# Patient Record
Sex: Male | Born: 1970
Health system: Southern US, Community
[De-identification: ages and names within clinical notes are randomized; demographics above are authoritative.]

## PROBLEM LIST (undated history)

## (undated) DIAGNOSIS — E785 Hyperlipidemia, unspecified: Secondary | ICD-10-CM

## (undated) DIAGNOSIS — T7840XA Allergy, unspecified, initial encounter: Secondary | ICD-10-CM

## (undated) HISTORY — DX: Allergy, unspecified, initial encounter: T78.40XA

## (undated) HISTORY — PX: WISDOM TOOTH EXTRACTION: SHX21

## (undated) HISTORY — DX: Hyperlipidemia, unspecified: E78.5

---

## 1998-01-22 ENCOUNTER — Emergency Department (HOSPITAL_COMMUNITY): Admission: EM | Admit: 1998-01-22 | Discharge: 1998-01-22 | Payer: Self-pay | Admitting: Emergency Medicine

## 2003-11-04 ENCOUNTER — Encounter: Admission: RE | Admit: 2003-11-04 | Discharge: 2003-11-04 | Payer: Self-pay | Admitting: Otolaryngology

## 2004-12-21 ENCOUNTER — Ambulatory Visit (HOSPITAL_COMMUNITY): Admission: RE | Admit: 2004-12-21 | Discharge: 2004-12-21 | Payer: Self-pay | Admitting: Orthopedic Surgery

## 2016-07-02 ENCOUNTER — Encounter: Payer: Self-pay | Admitting: Sports Medicine

## 2016-07-02 ENCOUNTER — Ambulatory Visit
Admission: RE | Admit: 2016-07-02 | Discharge: 2016-07-02 | Disposition: A | Discharge status: Home / Self Care | Payer: BC Managed Care – PPO | Source: Ambulatory Visit | Attending: Sports Medicine | Admitting: Sports Medicine

## 2016-07-02 ENCOUNTER — Ambulatory Visit (INDEPENDENT_AMBULATORY_CARE_PROVIDER_SITE_OTHER): Payer: BC Managed Care – PPO | Admitting: Sports Medicine

## 2016-07-02 ENCOUNTER — Ambulatory Visit: Payer: Self-pay

## 2016-07-02 VITALS — BP 128/80 | HR 72 | Ht 70.0 in | Wt 174.0 lb

## 2016-07-02 DIAGNOSIS — M25512 Pain in left shoulder: Secondary | ICD-10-CM

## 2016-07-02 MED ORDER — NITROGLYCERIN 0.2 MG/HR TD PT24: MEDICATED_PATCH | TRANSDERMAL | 1 refills | Status: DC

## 2016-07-02 NOTE — Assessment & Plan Note (Signed)
Findings on ultrasound are consistent with a partial supraspinatus tear. His strength is maintained on exam. - Initiated nitroglycerin protocol - Provided a home exercise program - Provided therapy and - Complete x-rays of his left shoulder - He will follow-up in 6 weeks to monitor for improvement.

## 2016-07-02 NOTE — Patient Instructions (Signed)
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
## 2016-07-30 ENCOUNTER — Ambulatory Visit: Payer: Self-pay | Admitting: Sports Medicine

## 2016-08-27 ENCOUNTER — Encounter: Payer: Self-pay | Admitting: Sports Medicine

## 2016-08-27 ENCOUNTER — Ambulatory Visit (INDEPENDENT_AMBULATORY_CARE_PROVIDER_SITE_OTHER): Payer: BC Managed Care – PPO | Admitting: Sports Medicine

## 2016-08-27 ENCOUNTER — Ambulatory Visit: Payer: Self-pay

## 2016-08-27 VITALS — BP 113/78 | HR 69 | Ht 70.0 in | Wt 174.0 lb

## 2016-08-27 DIAGNOSIS — M25512 Pain in left shoulder: Secondary | ICD-10-CM

## 2016-08-27 NOTE — Progress Notes (Signed)
  Kyle Bradford - 46 y.o. male MRN [id number]  Date of birth: 02/14/1971  SUBJECTIVE:  Including CC & ROS.   Kyle Bradford is a 46 yo M that is following up for a left partial supraspinatus tear. He has been using a nitro patch and reports improvement in his pain. He has also been doing home exercises. Hasn't been using any medication for pain. He still has been doing certain activities such as putting on his coat. He has some pain with performing a fly while working out.   ROS: No unexpected weight loss, fever, chills, swelling, instability, numbness/tingling, redness, otherwise see HPI    HISTORY: Past Medical, Surgical, Social, and Family History Reviewed & Updated per EMR.   Pertinent Historical Findings include: PSHx -  Never smoker.   DATA REVIEWED: 07/02/16: left shoulder x-ray: no bony pathology  07/02/16: previous ultrasound.   PHYSICAL EXAM:  VS: BP:113/78  HR:69bpm  TEMP: ( )  RESP:   HT:5\' 10"  (177.8 cm)   WT:174 lb (78.9 kg)  BMI:25 PHYSICAL EXAM: Gen: NAD, alert, cooperative with exam, well-appearing HEENT: clear conjunctiva, EOMI CV:  no edema, capillary refill brisk,  Resp: non-labored, normal speech Skin: no rashes, normal turgor  Neuro: no gross deficits.  Psych:  alert and oriented Shoulder: Inspection reveals no abnormalities, atrophy or asymmetry. Palpation is normal with no tenderness over AC joint or bicipital groove. ROM is full in all planes. Rotator cuff strength normal throughout. No signs of impingement with negative Neer and Hawkin's tests, empty can sign. Speeds tests normal. No labral pathology noted with negative Obrien's Normal scapular function observed. No painful arc and no drop arm sign. No apprehension sign Neurovascularly intact   Ultrasound: left shoulder:  The BT was normal appearance in short axis.  The supraspinatus had mild changes consistent with tendinopathy but the partial tear previously seen was much small compared to  previous studies. Small areas of hyperechonicity to suggest scarring taking place. Appears to be 95% healed.  The subscapularis was normal in appearance.  The infraspinatus was normal in appearance.  The [facility] joint did not demonstrate significant degenerative change.   Summary: healing partial tear of supraspinatus.      Ultrasound and interpretation by Clearance Coots, MD   ASSESSMENT & PLAN:   Acute pain of left shoulder Significant improvement in his [place] scan today compared to previous study. Still maintains good strength exam.  - continue nitro patches.  - f/u in 6 weeks to scan one more time.

## 2016-08-29 NOTE — Assessment & Plan Note (Signed)
Significant improvement in his [place] scan today compared to previous study. Still maintains good strength exam.  - continue nitro patches.  - f/u in 6 weeks to scan one more time.

## 2018-05-14 ENCOUNTER — Encounter: Payer: Self-pay | Admitting: Sports Medicine

## 2018-05-14 ENCOUNTER — Ambulatory Visit: Payer: BC Managed Care – PPO | Admitting: Sports Medicine

## 2018-05-14 ENCOUNTER — Ambulatory Visit: Payer: Self-pay

## 2018-05-14 VITALS — BP 118/82 | Ht 70.0 in | Wt 174.0 lb

## 2018-05-14 DIAGNOSIS — M25512 Pain in left shoulder: Principal | ICD-10-CM

## 2018-05-14 DIAGNOSIS — M25511 Pain in right shoulder: Secondary | ICD-10-CM

## 2018-05-14 MED ORDER — PREDNISONE 20 MG PO TABS: 20.0000 mg | ORAL_TABLET | Freq: Two times a day (BID) | ORAL | 0 refills | Status: DC

## 2018-05-14 NOTE — Patient Instructions (Signed)
You have an overuse tendonitis of the infraspinatus tendon (part of your rotator cuff). We will send a short course of medication to take to decrease the inflammation. Please take this with food. This should be much better in 6 weeks and fully recovered in 3 months. Call us if things are not much better in 6 weeks and come back to be seen. You should avoid the yoga moves that are causing pain until you recover.  To strengthen your rotator cuff muscles, please do the following exercises we discussed:   Use a light weight (3-5lb dumbell) to do the following daily (3 sets of 15):   External rotation Raise arms to 90 degrees, rotate backwards Lateral raise from standing Fly Diagonal raise
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
## 2018-05-14 NOTE — Assessment & Plan Note (Signed)
Prednisone - short course HEP to emphasize ER  Modify yoga training and lifts  Reck if not resolved in 12 wks

## 2019-03-05 ENCOUNTER — Other Ambulatory Visit: Payer: Self-pay

## 2019-03-05 DIAGNOSIS — Z20822 Contact with and (suspected) exposure to covid-19: Secondary | ICD-10-CM

## 2019-03-06 LAB — NOVEL CORONAVIRUS, NAA: SARS-CoV-2, NAA: NOT DETECTED

## 2019-04-05 ENCOUNTER — Other Ambulatory Visit: Payer: Self-pay

## 2019-04-05 DIAGNOSIS — Z20822 Contact with and (suspected) exposure to covid-19: Secondary | ICD-10-CM

## 2019-04-06 LAB — NOVEL CORONAVIRUS, NAA: SARS-CoV-2, NAA: NOT DETECTED

## 2019-06-14 ENCOUNTER — Ambulatory Visit: Payer: Self-pay | Attending: Internal Medicine

## 2019-06-14 DIAGNOSIS — Z20822 Contact with and (suspected) exposure to covid-19: Secondary | ICD-10-CM | POA: Insufficient documentation

## 2019-06-15 LAB — NOVEL CORONAVIRUS, NAA: SARS-CoV-2, NAA: NOT DETECTED

## 2020-03-04 DIAGNOSIS — Z20828 Contact with and (suspected) exposure to other viral communicable diseases: Secondary | ICD-10-CM | POA: Diagnosis not present

## 2020-05-04 DIAGNOSIS — Z20822 Contact with and (suspected) exposure to covid-19: Secondary | ICD-10-CM | POA: Diagnosis not present

## 2020-09-18 DIAGNOSIS — D485 Neoplasm of uncertain behavior of skin: Secondary | ICD-10-CM | POA: Diagnosis not present

## 2020-09-18 DIAGNOSIS — L430 Hypertrophic lichen planus: Secondary | ICD-10-CM | POA: Diagnosis not present

## 2020-09-27 DIAGNOSIS — D2262 Melanocytic nevi of left upper limb, including shoulder: Secondary | ICD-10-CM | POA: Diagnosis not present

## 2020-09-27 DIAGNOSIS — D2272 Melanocytic nevi of left lower limb, including hip: Secondary | ICD-10-CM | POA: Diagnosis not present

## 2020-09-27 DIAGNOSIS — D2261 Melanocytic nevi of right upper limb, including shoulder: Secondary | ICD-10-CM | POA: Diagnosis not present

## 2020-09-27 DIAGNOSIS — D225 Melanocytic nevi of trunk: Secondary | ICD-10-CM | POA: Diagnosis not present

## 2020-09-27 DIAGNOSIS — D1801 Hemangioma of skin and subcutaneous tissue: Secondary | ICD-10-CM | POA: Diagnosis not present

## 2020-09-27 DIAGNOSIS — D2271 Melanocytic nevi of right lower limb, including hip: Secondary | ICD-10-CM | POA: Diagnosis not present

## 2020-09-27 DIAGNOSIS — D2371 Other benign neoplasm of skin of right lower limb, including hip: Secondary | ICD-10-CM | POA: Diagnosis not present

## 2020-10-09 DIAGNOSIS — Z0000 Encounter for general adult medical examination without abnormal findings: Secondary | ICD-10-CM | POA: Diagnosis not present

## 2020-10-18 ENCOUNTER — Other Ambulatory Visit (HOSPITAL_BASED_OUTPATIENT_CLINIC_OR_DEPARTMENT_OTHER): Payer: Self-pay | Admitting: Family Medicine

## 2020-10-18 DIAGNOSIS — E7800 Pure hypercholesterolemia, unspecified: Secondary | ICD-10-CM

## 2020-10-19 ENCOUNTER — Other Ambulatory Visit: Payer: Self-pay

## 2020-10-19 ENCOUNTER — Ambulatory Visit (HOSPITAL_BASED_OUTPATIENT_CLINIC_OR_DEPARTMENT_OTHER)
Admission: RE | Admit: 2020-10-19 | Discharge: 2020-10-19 | Disposition: A | Discharge status: Home / Self Care | Payer: Self-pay | Source: Ambulatory Visit | Attending: Family Medicine | Admitting: Family Medicine

## 2020-10-19 DIAGNOSIS — E7800 Pure hypercholesterolemia, unspecified: Secondary | ICD-10-CM | POA: Insufficient documentation

## 2020-10-23 DIAGNOSIS — Z1211 Encounter for screening for malignant neoplasm of colon: Secondary | ICD-10-CM | POA: Diagnosis not present

## 2020-10-23 DIAGNOSIS — E7800 Pure hypercholesterolemia, unspecified: Secondary | ICD-10-CM | POA: Diagnosis not present

## 2020-10-23 DIAGNOSIS — Z0000 Encounter for general adult medical examination without abnormal findings: Secondary | ICD-10-CM | POA: Diagnosis not present

## 2020-10-23 DIAGNOSIS — Z23 Encounter for immunization: Secondary | ICD-10-CM | POA: Diagnosis not present

## 2020-12-25 ENCOUNTER — Ambulatory Visit (AMBULATORY_SURGERY_CENTER): Payer: 59

## 2020-12-25 ENCOUNTER — Other Ambulatory Visit: Payer: Self-pay

## 2020-12-25 VITALS — Ht 70.0 in | Wt 184.0 lb

## 2020-12-25 DIAGNOSIS — Z23 Encounter for immunization: Secondary | ICD-10-CM | POA: Diagnosis not present

## 2020-12-25 DIAGNOSIS — Z1211 Encounter for screening for malignant neoplasm of colon: Secondary | ICD-10-CM

## 2020-12-25 MED ORDER — NA SULFATE-K SULFATE-MG SULF 17.5-3.13-1.6 GM/177ML PO SOLN: 1.0000 | ORAL | 0 refills | Status: DC

## 2020-12-25 NOTE — Progress Notes (Signed)
Patient's pre-visit was done today over the phone with the patient due to COVID-19 pandemic. Name,DOB and address verified. Insurance verified. Patient denies any allergies to Eggs and Soy. Patient denies any problems with anesthesia/sedation. Patient denies taking diet pills or blood thinners. No home Oxygen. Packet of Prep instructions mailed to patient including a copy of a consent form-pt is aware. Patient understands to call us back with any questions or concerns. Patient is aware of our care-partner policy and Covid-19 safety protocol.   EMMI education assigned to the patient for the procedure, sent to MyChart.   The patient is COVID-19 vaccinated, per patient.  

## 2021-01-08 ENCOUNTER — Other Ambulatory Visit: Payer: Self-pay

## 2021-01-08 ENCOUNTER — Ambulatory Visit (AMBULATORY_SURGERY_CENTER): Payer: 59 | Admitting: Gastroenterology

## 2021-01-08 ENCOUNTER — Encounter: Payer: Self-pay | Admitting: Gastroenterology

## 2021-01-08 VITALS — BP 111/78 | HR 55 | Temp 99.1°F | Resp 14 | Ht 70.0 in | Wt 184.0 lb

## 2021-01-08 DIAGNOSIS — Z1211 Encounter for screening for malignant neoplasm of colon: Secondary | ICD-10-CM | POA: Diagnosis not present

## 2021-01-08 DIAGNOSIS — K635 Polyp of colon: Secondary | ICD-10-CM | POA: Diagnosis not present

## 2021-01-08 DIAGNOSIS — K621 Rectal polyp: Secondary | ICD-10-CM | POA: Diagnosis not present

## 2021-01-08 DIAGNOSIS — D125 Benign neoplasm of sigmoid colon: Secondary | ICD-10-CM

## 2021-01-08 DIAGNOSIS — D128 Benign neoplasm of rectum: Secondary | ICD-10-CM

## 2021-01-08 MED ORDER — SODIUM CHLORIDE 0.9 % IV SOLN
500.0000 mL | Freq: Once | INTRAVENOUS | Status: DC
Start: 2021-01-08 — End: 2021-01-08

## 2021-01-08 NOTE — Progress Notes (Signed)
pt tolerated well. VSS. awake and to recovery. Report given to RN.  

## 2021-01-08 NOTE — Op Note (Signed)
[place] Patient Name: Kyle Bradford Procedure Date: 01/08/2021 10:08 AM MRN: [id number] Endoscopist: Nicki Reaper E. Candis Schatz , MD Age: 50 Referring MD:  Date of Birth: May 10, 1971 Gender: Male Account #: [id number] Procedure:                Colonoscopy Indications:              Screening for colorectal malignant neoplasm, This                            is the patient's first colonoscopy Medicines:                Monitored Anesthesia Care Procedure:                Pre-Anesthesia Assessment:                           - Prior to the procedure, a History and Physical                            was performed, and patient medications and                            allergies were reviewed. The patient's tolerance of                            previous anesthesia was also reviewed. The risks                            and benefits of the procedure and the sedation                            options and risks were discussed with the patient.                            All questions were answered, and informed consent                            was obtained. Prior Anticoagulants: The patient has                            taken no previous anticoagulant or antiplatelet                            agents. ASA Grade Assessment: II - A patient with                            mild systemic disease. After reviewing the risks                            and benefits, the patient was deemed in                            satisfactory condition to undergo the procedure.  After obtaining informed consent, the colonoscope                            was passed under direct vision. Throughout the                            procedure, the patient's blood pressure, pulse, and                            oxygen saturations were monitored continuously. The                            CF [id number] was introduced through the anus                            and advanced to the  the terminal ileum, with                            identification of the appendiceal orifice and IC                            valve. The colonoscopy was performed without                            difficulty. The patient tolerated the procedure                            well. The quality of the bowel preparation was                            adequate. The terminal ileum, ileocecal valve,                            appendiceal orifice, and rectum were photographed. Scope In: 10:18:03 AM Scope Out: 10:43:20 AM Scope Withdrawal Time: 0 hours 22 minutes 53 seconds  Total Procedure Duration: 0 hours 25 minutes 17 seconds  Findings:                 The perianal and digital rectal examinations were                            normal. Pertinent negatives include normal                            sphincter tone and no palpable rectal lesions.                           Two sessile polyps were found in the sigmoid colon.                            The polyps were 2 to 3 mm in size. These polyps                            were removed with  a jumbo cold forceps. Resection                            and retrieval were complete. Estimated blood loss                            was minimal.                           Two flat polyps were found in the sigmoid colon.                            The polyps were 3 to 5 mm in size. These polyps                            were removed with a cold snare. Resection and                            retrieval were complete. Estimated blood loss was                            minimal.                           Many flat and sessile polyps were found in the                            rectum. The polyps were 1 to 3 mm in size. These                            polyps were removed with a cold snare. Resection                            and retrieval were complete. Estimated blood loss                            was minimal.                           The exam was  otherwise normal throughout the                            examined colon.                           The terminal ileum appeared normal.                           The retroflexed view of the distal rectum and anal                            verge was normal and showed no anal or rectal  abnormalities. Complications:            No immediate complications. Estimated Blood Loss:     Estimated blood loss was minimal. Impression:               - Two 2 to 3 mm polyps in the sigmoid colon,                            removed with a jumbo cold forceps. Resected and                            retrieved.                           - Two 3 to 5 mm polyps in the sigmoid colon,                            removed with a cold snare. Resected and retrieved.                           - Many 1 to 3 mm polyps in the rectum, removed with                            a cold snare. Resected and retrieved.                           - The examined portion of the ileum was normal.                           - The distal rectum and anal verge are normal on                            retroflexion view. Recommendation:           - Patient has a contact number available for                            emergencies. The signs and symptoms of potential                            delayed complications were discussed with the                            patient. Return to normal activities tomorrow.                            Written discharge instructions were provided to the                            patient.                           - Resume previous diet.                           - Continue present  medications.                           - Await pathology results.                           - Repeat colonoscopy (date not yet determined) for                            surveillance based on pathology results. Hayzen Lorenson E. Candis Schatz, MD 01/08/2021 10:49:04 AM This report has been signed  electronically.

## 2021-01-08 NOTE — Progress Notes (Signed)
Called to room to assist during endoscopic procedure.  Patient ID and intended procedure confirmed with present staff. Received instructions for my participation in the procedure from the performing physician.  

## 2021-01-08 NOTE — Progress Notes (Signed)
VS-CW  Pt's states no medical or surgical changes since previsit or office visit.  

## 2021-01-08 NOTE — Progress Notes (Signed)
   HPI : 50 y/o male presenting for initial average risk screening colonoscopy.  He has no chronic GI symptoms and no family history of colon cancer.   Past Medical History:  Diagnosis Date   Allergy    Hyperlipidemia      Past Surgical History:  Procedure Laterality Date   WISDOM TOOTH EXTRACTION     x4   Family History  Problem Relation Age of Onset   Colon cancer Neg Hx    Colon polyps Neg Hx    Esophageal cancer Neg Hx    Rectal cancer Neg Hx    Stomach cancer Neg Hx    Social History   Tobacco Use   Smoking status: Former    Packs/day: 0.25    Types: Cigarettes    Quit date: 2004    Years since quitting: 18.6   Smokeless tobacco: Never  Vaping Use   Vaping Use: Never used  Substance Use Topics   Alcohol use: Yes    Comment: 1 drink per night wine/liquor   Drug use: Never   Current Outpatient Medications  Medication Sig Dispense Refill   loratadine (CLARITIN) 10 MG tablet Take 10 mg by mouth daily.     Current Facility-Administered Medications  Medication Dose Route Frequency Provider Last Rate Last Admin   0.9 %  sodium chloride infusion  500 mL Intravenous Once Daryel November, MD       No Known Allergies   Review of Systems: All systems reviewed and negative except where noted in HPI.    No results found.  Physical Exam: BP (!) 152/94   Pulse 71   Temp 99.1 F (37.3 C) (Temporal)   Ht '5\' 10"'$  (1.778 m)   Wt 184 lb (83.5 kg)   SpO2 99%   BMI 26.40 kg/m  Constitutional: Pleasant,well-developed, Caucasian male in no acute distress. HEENT: Normocephalic and atraumatic. Conjunctivae are normal. No scleral icterus. MP2 Cardiovascular: Normal rate, regular rhythm.  Pulmonary/chest: Effort normal and breath sounds normal. No wheezing, rales or rhonchi. Abdominal: Soft, nondistended, nontender. Bowel sounds active throughout. There are no masses palpable. No hepatomegaly. Neurological: Alert and oriented to person place and time. Skin: Skin is  warm and dry. No rashes noted. Psychiatric: Normal mood and affect. Behavior is normal.  CBC No results found for: WBC, RBC, HGB, HCT, PLT, MCV, MCH, MCHC, RDW, LYMPHSABS, MONOABS, EOSABS, BASOSABS  CMP  No results found for: NA, K, CL, CO2, GLUCOSE, BUN, CREATININE, CALCIUM, PROT, ALBUMIN, AST, ALT, ALKPHOS, BILITOT, GFRNONAA, GFRAA   ASSESSMENT AND PLAN: 50 y/o male presenting for initial average risk screening colonoscopy.  Macai Sisneros E. Candis Schatz, MD [facility]  [place], Delsa Grana, MD

## 2021-01-08 NOTE — Patient Instructions (Signed)
Await pathology  Please read over handout about polyps  Continue your normal medications   YOU HAD AN ENDOSCOPIC PROCEDURE TODAY AT THE [facility]:   Refer to the procedure report that was given to you for any specific questions about what was found during the examination.  If the procedure report does not answer your questions, please call your gastroenterologist to clarify.  If you requested that your care partner not be given the details of your procedure findings, then the procedure report has been included in a sealed envelope for you to review at your convenience later.  YOU SHOULD EXPECT: Some feelings of bloating in the abdomen. Passage of more gas than usual.  Walking can help get rid of the air that was put into your GI tract during the procedure and reduce the bloating. If you had a lower endoscopy (such as a colonoscopy or flexible sigmoidoscopy) you may notice spotting of blood in your stool or on the toilet paper. If you underwent a bowel prep for your procedure, you may not have a normal bowel movement for a few days.  Please Note:  You might notice some irritation and congestion in your nose or some drainage.  This is from the oxygen used during your procedure.  There is no need for concern and it should clear up in a day or so.  SYMPTOMS TO REPORT IMMEDIATELY:  Following lower endoscopy (colonoscopy or flexible sigmoidoscopy):  Excessive amounts of blood in the stool  Significant tenderness or worsening of abdominal pains  Swelling of the abdomen that is new, acute  Fever of 100F or higher  For urgent or emergent issues, a gastroenterologist can be reached at any hour by calling [phone]. Do not use MyChart messaging for urgent concerns.    DIET:  We do recommend a small meal at first, but then you may proceed to your regular diet.  Drink plenty of fluids but you should avoid alcoholic beverages for 24 hours.  ACTIVITY:  You should plan to take it easy  for the rest of today and you should NOT DRIVE or use heavy machinery until tomorrow (because of the sedation medicines used during the test).    FOLLOW UP: Our staff will call the number listed on your records 48-72 hours following your procedure to check on you and address any questions or concerns that you may have regarding the information given to you following your procedure. If we do not reach you, we will leave a message.  We will attempt to reach you two times.  During this call, we will ask if you have developed any symptoms of COVID 19. If you develop any symptoms (ie: fever, flu-like symptoms, shortness of breath, cough etc.) before then, please call [phone].  If you test positive for Covid 19 in the 2 weeks post procedure, please call and report this information to us.    If any biopsies were taken you will be contacted by phone or by letter within the next 1-3 weeks.  Please call us at [phone] if you have not heard about the biopsies in 3 weeks.    SIGNATURES/CONFIDENTIALITY: You and/or your care partner have signed paperwork which will be entered into your electronic medical record.  These signatures attest to the fact that that the information above on your After Visit Summary has been reviewed and is understood.  Full responsibility of the confidentiality of this discharge information lies with you and/or your care-partner.  

## 2021-01-10 ENCOUNTER — Telehealth: Payer: Self-pay | Admitting: *Deleted

## 2021-01-10 NOTE — Telephone Encounter (Signed)
  Follow up Call-  Call back number 01/08/2021  Post procedure Call Back phone  # [phone]  Permission to leave phone message Yes  Some recent data might be hidden     Patient questions:  Do you have a fever, pain , or abdominal swelling? No. Pain Score  0 *  Have you tolerated food without any problems? Yes.    Have you been able to return to your normal activities? Yes.    Do you have any questions about your discharge instructions: Diet   No. Medications  No. Follow up visit  No.  Do you have questions or concerns about your Care? No.  Actions: * If pain score is 4 or above: No action needed, pain <4.  Have you developed a fever since your procedure? no  2.   Have you had an respiratory symptoms (SOB or cough) since your procedure? no  3.   Have you tested positive for COVID 19 since your procedure no  4.   Have you had any family members/close contacts diagnosed with the COVID 19 since your procedure?  no   If yes to any of these questions please route to Joylene John, RN and Joella Prince, RN

## 2021-01-10 NOTE — Telephone Encounter (Signed)
Attempted f/u phone call. No answer. Left message. °

## 2021-01-15 ENCOUNTER — Encounter: Payer: Self-pay | Admitting: Gastroenterology

## 2021-10-29 IMAGING — CT CT CARDIAC CORONARY ARTERY CALCIUM SCORE
3 series · 14 of 20 positions shown, 16 images · non-contrast
Comparison: None.
COMPARISON: None.

Addendum:
EXAM:
OVER-READ INTERPRETATION  CT CHEST

The following report is an over-read performed by radiologist Dr.
Lizzbeth Moso [REDACTED] on 10/19/2020. This
over-read does not include interpretation of cardiac or coronary
anatomy or pathology. The coronary calcium score interpretation by
the cardiologist is attached.
CLINICAL DATA: Cardiovascular Disease Risk stratification
Coronary Calcium Score
TECHNIQUE: A gated, non-contrast computed tomography scan of the heart was
performed using 3mm slice thickness. Axial images were analyzed on a
dedicated workstation. Calcium scoring of the coronary arteries was
performed using the Agatston method.

[Series 2: casc 3.0 best diast 70 % (id) · axial · 0.39mm/px · z∈[+1179,+1260]mm · 4 of 46 slices shown]
[im 10/46  vessel]
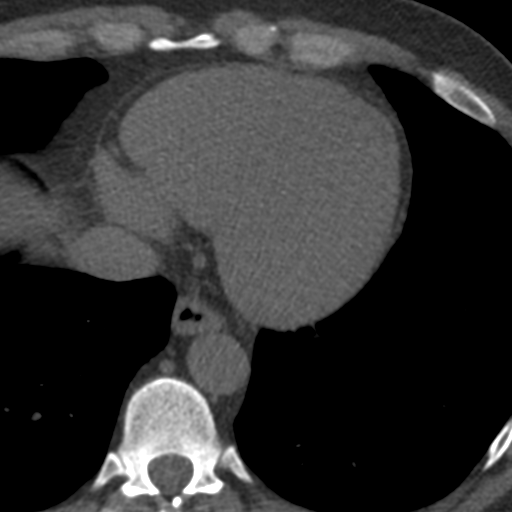
[im 19/46  vessel]
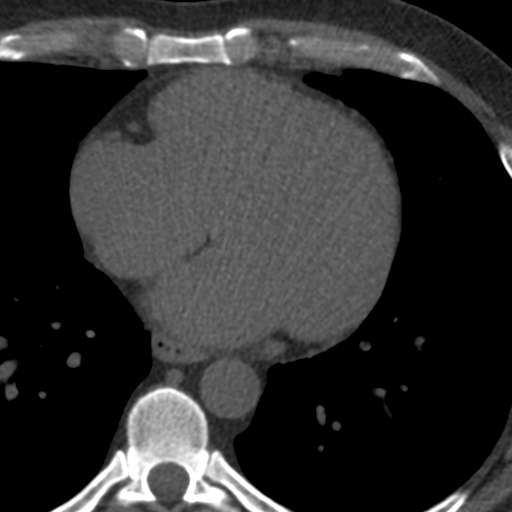
[im 28/46  vessel]
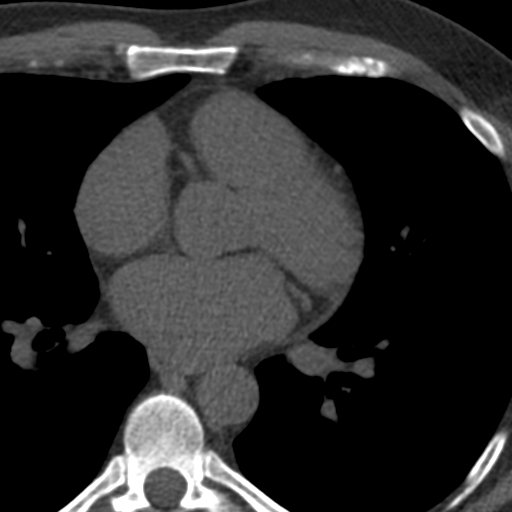
[im 37/46  vessel]
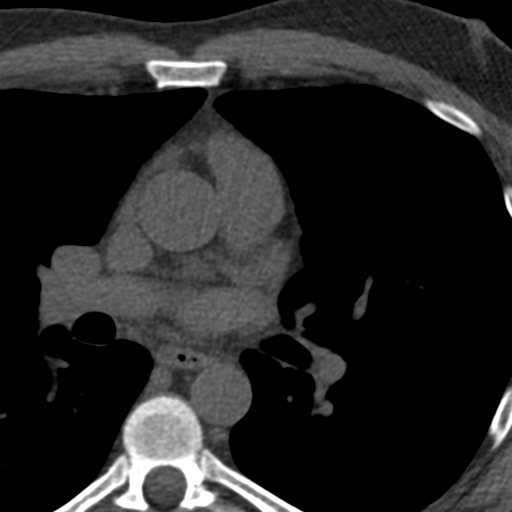

[Series 3: soft full fov 71 % · axial · 0.66mm/px · z∈[+1173,+1263]mm · 5 of 46 slices shown, 7 images]
[im 8/46  vessel]
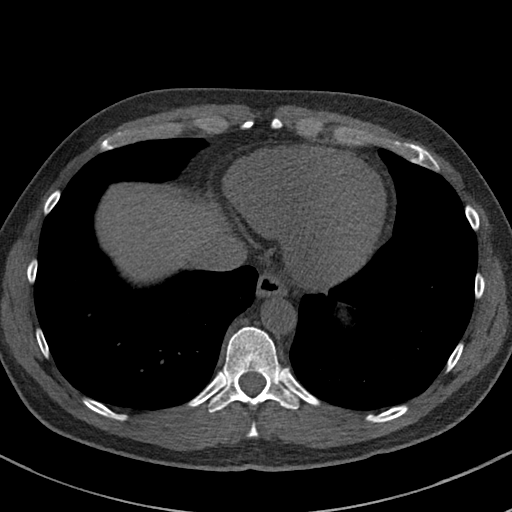
[im 8/46  lung]
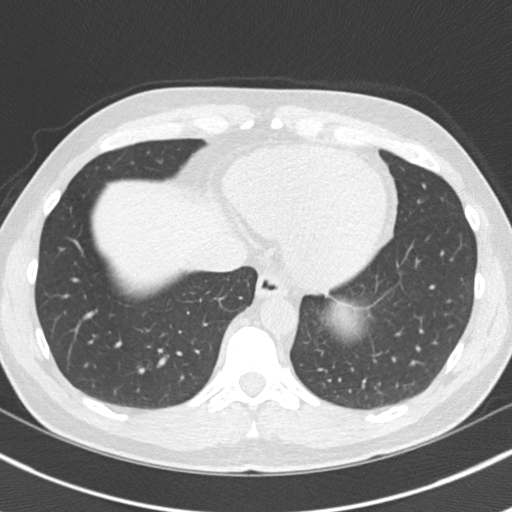
[im 16/46  vessel]
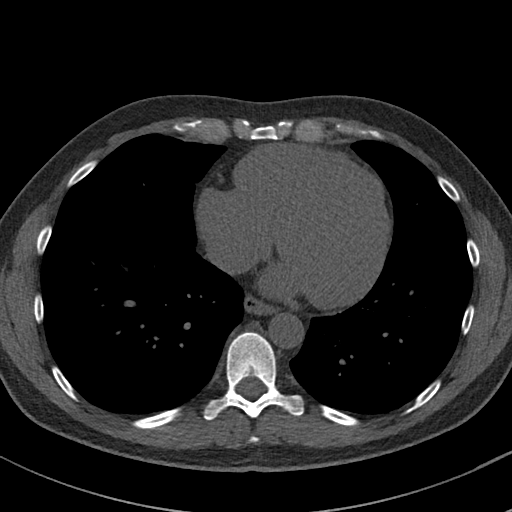
[im 23/46  vessel]
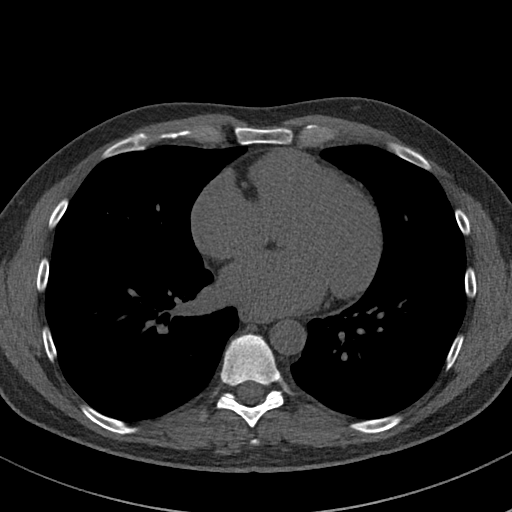
[im 31/46  vessel]
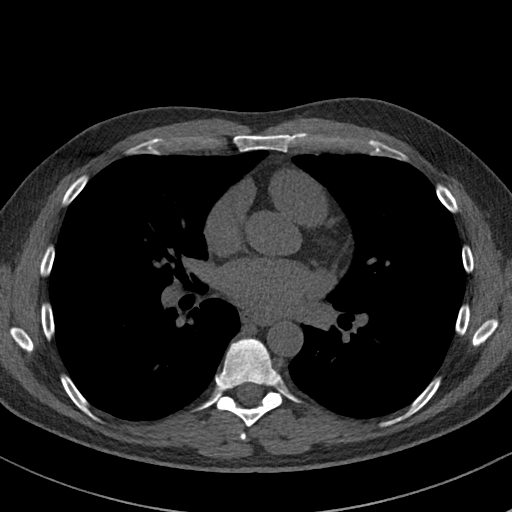
[im 38/46  vessel]
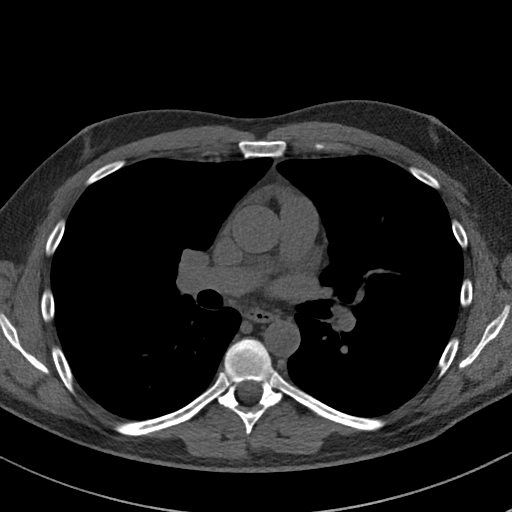
[im 38/46  lung]
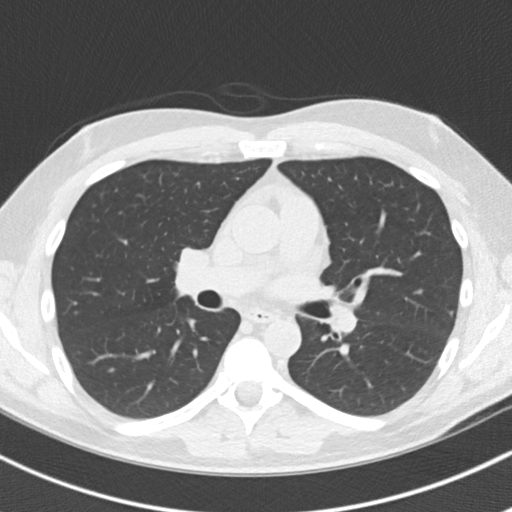

[Series 4: lungs 71 % · axial · 0.66mm/px · z∈[+1173,+1263]mm · 5 of 46 slices shown]
[im 8/46  vessel]
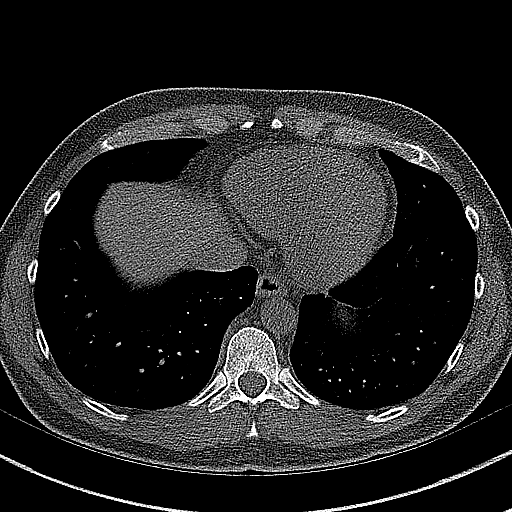
[im 16/46  vessel]
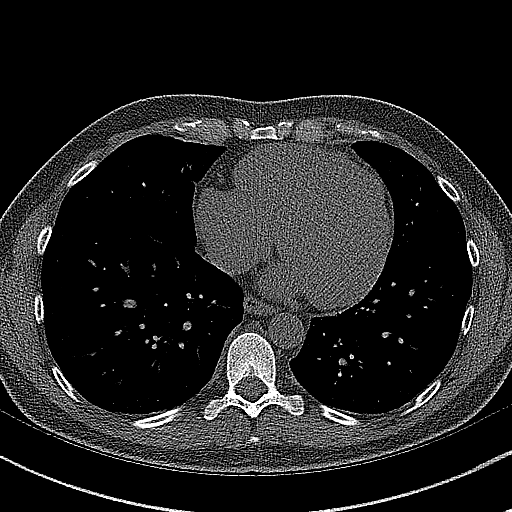
[im 23/46  vessel]
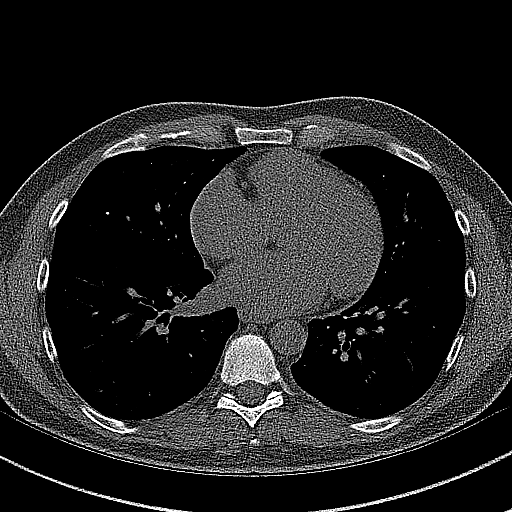
[im 31/46  vessel]
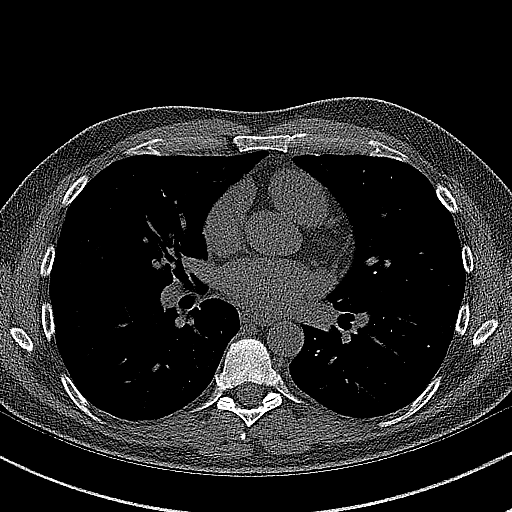
[im 38/46  vessel]
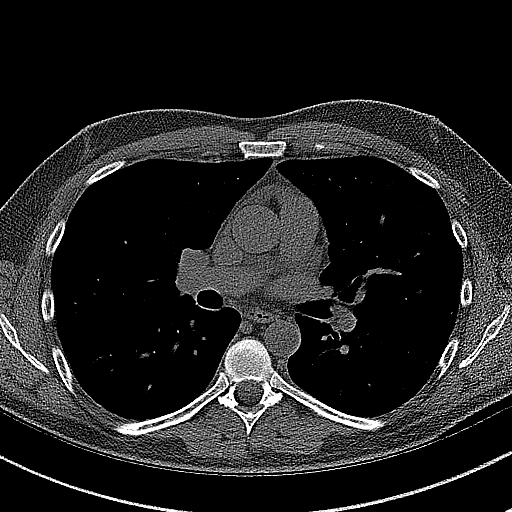

[14 of 20 positions shown; findings below may reference images not displayed]

FINDINGS: Within the visualized portions of the thorax there are no suspicious
appearing pulmonary nodules or masses, there is no acute
consolidative airspace disease, no pleural effusions, no
pneumothorax and no lymphadenopathy. Visualized portions of the
upper abdomen demonstrate a 1.2 cm low-attenuation lesion in segment
2 of the liver, incompletely characterized on today's non-contrast
CT examination, but statistically likely to represent a small cyst.
There are no aggressive appearing lytic or blastic lesions noted in
the visualized portions of the skeleton.
IMPRESSION: 1. No significant incidental noncardiac findings are noted.
FINDINGS: Coronary arteries: Normal origins.

Coronary Calcium Score:

Left main: 0

Left anterior descending artery: 0

Left circumflex artery: 0

Right coronary artery: 0

Total: 0

Percentile: 0

Pericardium: Normal.

Ascending Aorta: Normal caliber.

Non-cardiac: See separate report from [REDACTED].
IMPRESSION: Coronary calcium score of 0. This was 0 percentile for age-, race-,
and sex-matched controls.



If CAC=0, it is reasonable to withhold statin therapy and reassess
in 5 to 10 years, as long as higher risk conditions are absent
(diabetes mellitus, family history of premature CHD in first degree
relatives (males <55 years; females <65 years), cigarette smoking,
or LDL >=190 mg/dL).

If CAC is 1 to 99, it is reasonable to initiate statin therapy for
patients >=55 years of age.

If CAC is >=100 or >=75th percentile, it is reasonable to initiate
statin therapy at any age.

Cardiology referral should be considered for patients with CAC
scores >=400 or >=75th percentile.

*1959 AHA/ACC/AACVPR/AAPA/ABC/BERIKIMAS/JONATHAN FER/SIDDHANT/[place]/ERMELINDA/DEMITRI/TIGER
Guideline on the Management of Blood Cholesterol: A Report of the
American College of Cardiology/American Heart Association Task Force
on Clinical Practice Guidelines. J Am Coll Cardiol.
4889;73(24):2828-2256.

*** End of Addendum ***
EXAM:
OVER-READ INTERPRETATION  CT CHEST

The following report is an over-read performed by radiologist Dr.
Lizzbeth Moso [REDACTED] on 10/19/2020. This
over-read does not include interpretation of cardiac or coronary
anatomy or pathology. The coronary calcium score interpretation by
the cardiologist is attached.
FINDINGS: Within the visualized portions of the thorax there are no suspicious
appearing pulmonary nodules or masses, there is no acute
consolidative airspace disease, no pleural effusions, no
pneumothorax and no lymphadenopathy. Visualized portions of the
upper abdomen demonstrate a 1.2 cm low-attenuation lesion in segment
2 of the liver, incompletely characterized on today's non-contrast
CT examination, but statistically likely to represent a small cyst.
There are no aggressive appearing lytic or blastic lesions noted in
the visualized portions of the skeleton.
IMPRESSION: 1. No significant incidental noncardiac findings are noted.

## 2021-11-12 DIAGNOSIS — E7800 Pure hypercholesterolemia, unspecified: Secondary | ICD-10-CM | POA: Diagnosis not present

## 2021-11-12 DIAGNOSIS — Z125 Encounter for screening for malignant neoplasm of prostate: Secondary | ICD-10-CM | POA: Diagnosis not present

## 2021-11-12 DIAGNOSIS — Z0000 Encounter for general adult medical examination without abnormal findings: Secondary | ICD-10-CM | POA: Diagnosis not present

## 2022-10-22 ENCOUNTER — Other Ambulatory Visit (HOSPITAL_COMMUNITY): Payer: Self-pay

## 2022-10-22 ENCOUNTER — Other Ambulatory Visit: Payer: Self-pay

## 2022-10-22 ENCOUNTER — Ambulatory Visit (INDEPENDENT_AMBULATORY_CARE_PROVIDER_SITE_OTHER): Payer: 59 | Admitting: Sports Medicine

## 2022-10-22 VITALS — BP 124/80 | Ht 70.0 in | Wt 185.0 lb

## 2022-10-22 DIAGNOSIS — M7702 Medial epicondylitis, left elbow: Secondary | ICD-10-CM | POA: Diagnosis not present

## 2022-10-22 DIAGNOSIS — M25522 Pain in left elbow: Secondary | ICD-10-CM

## 2022-10-22 MED ORDER — NITROGLYCERIN 0.2 MG/HR TD PT24
MEDICATED_PATCH | TRANSDERMAL | 1 refills | Status: AC
  Filled 2022-10-22: qty 30, 90d supply, fill #0

## 2022-10-22 NOTE — Progress Notes (Signed)
Subjective:    Patient ID: Kyle Bradford, male    DOB: [birth year], 52 y.o.   MRN: [id number]  HPI  Pt is a 52 year old male with a distant history of bilateral shoulder pain which has since resolved who presents to clinic today with ~6 months of L medial elbow pain. Says that pain first initiated when trying to lift his 70 year old daughter overhead with supinated palms. He also restarted recreational rock climbing around that time and that seemed to cause flare in symptoms along left medial epicondyle.  Patient has occasionally taken ibuprofen for pain but for the most part has stopped all exercise/activities that reproduce pain.  This includes stopping weightlifting, rockclimbing, grasping heavy objects and lifting up his daughter.  Occasionally will go biking. Has not tried any topical medications, stretching/strengthening exercises, or bracing elbow.  Pain with focal tenderness directly over medial epicondyles.  Review of Systems  Negative except as noted above    Objective:   Physical Exam Constitutional:      General: He is not in acute distress.    Appearance: Normal appearance.  Musculoskeletal:        General: Tenderness and signs of injury present. No swelling or deformity. Normal range of motion.     Comments: Patient with tenderness to palpation over left medial epicondyles.  Occasionally pain radiates distally into proximal forearm.  No erythema, generalized edema, or ecchymosis present.  Wrist elbow range of motion within normal limits.  Pain worse with resisted wrist flexion.  Mild decrease in left grip strength.  Adequate UCL stability when performing milking maneuver.  Does have reproducible pain with golf elbow testing and heavy book supination testing.  Neurovascular intact.  No numbness or tingling.  Skin:    General: Skin is warm and dry.     Findings: No bruising, erythema, lesion or rash.  Neurological:     General: No focal deficit present.     Mental Status: He  is alert. Mental status is at baseline.  Psychiatric:        Mood and Affect: Mood normal.        Behavior: Behavior normal.        Thought Content: Thought content normal.        Judgment: Judgment normal.   Ultrasound of Left medial elbow Medial epicondyle noted to have a small avulsion at tip One area of localized spur Muscle appears intact Tendon without tear Neovessels at MT jxn and at the epicondule  Impression: Findings consistent with medial epicondylitis  Ultrasound and interpretation by Sibyl Parr. Anajah Sterbenz, MD        Assessment & Plan:   Left medial epicondylitis: Examined medial epicondyle under ultrasound today.  Evidence of chronic tendinitis with presence of enthesophytes.  No evidence of complete tendon tear.  No fractures present. Will initiate treatment using the following modalities: 1) Start on nitroglycerin patch to help increase blood flow for further healing.  Start using quarter of a patch at a time. 2) Home exercise provided including wrist flexion, pronation, and grip strength with emphasis placed on eccentric component of exercises.  Demonstrated in office today by myself and athletic trainers. 3) provided elbow compression sleeve to help prevent further edema.  Instructed to wear during any exercise and as needed for comfort. 4) follow-up in 4 to 6 weeks as needed if treatment plan outlined above not adequately improving symptoms.  I observed and examined the patient with the Dr. Fayrene Fearing ,resident and agree with  assessment and plan.  Note reviewed and modified by me.  Sterling Big, MD

## 2022-10-22 NOTE — Assessment & Plan Note (Signed)
SEE plan  HEP NTG protocol Compression sleeve for elbow  Reck 6 weeks

## 2023-01-22 DIAGNOSIS — Z1331 Encounter for screening for depression: Secondary | ICD-10-CM | POA: Diagnosis not present

## 2023-01-22 DIAGNOSIS — J309 Allergic rhinitis, unspecified: Secondary | ICD-10-CM | POA: Diagnosis not present

## 2023-01-22 DIAGNOSIS — Z125 Encounter for screening for malignant neoplasm of prostate: Secondary | ICD-10-CM | POA: Diagnosis not present

## 2023-01-22 DIAGNOSIS — Z0000 Encounter for general adult medical examination without abnormal findings: Secondary | ICD-10-CM | POA: Diagnosis not present

## 2023-01-22 DIAGNOSIS — E7800 Pure hypercholesterolemia, unspecified: Secondary | ICD-10-CM | POA: Diagnosis not present

## 2024-01-26 ENCOUNTER — Other Ambulatory Visit (HOSPITAL_BASED_OUTPATIENT_CLINIC_OR_DEPARTMENT_OTHER): Payer: Self-pay | Admitting: Family Medicine

## 2024-01-26 DIAGNOSIS — E7800 Pure hypercholesterolemia, unspecified: Secondary | ICD-10-CM

## 2024-02-17 ENCOUNTER — Ambulatory Visit (HOSPITAL_BASED_OUTPATIENT_CLINIC_OR_DEPARTMENT_OTHER)
Admission: RE | Admit: 2024-02-17 | Discharge: 2024-02-17 | Disposition: A | Discharge status: Home / Self Care | Payer: Self-pay | Source: Ambulatory Visit | Attending: Family Medicine | Admitting: Family Medicine

## 2024-02-17 DIAGNOSIS — E7800 Pure hypercholesterolemia, unspecified: Secondary | ICD-10-CM | POA: Insufficient documentation

## 2024-02-19 DIAGNOSIS — D1801 Hemangioma of skin and subcutaneous tissue: Secondary | ICD-10-CM | POA: Diagnosis not present

## 2024-02-19 DIAGNOSIS — D225 Melanocytic nevi of trunk: Secondary | ICD-10-CM | POA: Diagnosis not present

## 2024-02-19 DIAGNOSIS — D485 Neoplasm of uncertain behavior of skin: Secondary | ICD-10-CM | POA: Diagnosis not present

## 2024-03-02 DIAGNOSIS — D2261 Melanocytic nevi of right upper limb, including shoulder: Secondary | ICD-10-CM | POA: Diagnosis not present

## 2024-03-02 DIAGNOSIS — D2271 Melanocytic nevi of right lower limb, including hip: Secondary | ICD-10-CM | POA: Diagnosis not present

## 2024-03-02 DIAGNOSIS — D224 Melanocytic nevi of scalp and neck: Secondary | ICD-10-CM | POA: Diagnosis not present

## 2024-03-02 DIAGNOSIS — D225 Melanocytic nevi of trunk: Secondary | ICD-10-CM | POA: Diagnosis not present

## 2024-03-02 DIAGNOSIS — D1801 Hemangioma of skin and subcutaneous tissue: Secondary | ICD-10-CM | POA: Diagnosis not present

## 2024-03-02 DIAGNOSIS — L720 Epidermal cyst: Secondary | ICD-10-CM | POA: Diagnosis not present

## 2024-03-02 DIAGNOSIS — D2262 Melanocytic nevi of left upper limb, including shoulder: Secondary | ICD-10-CM | POA: Diagnosis not present

## 2024-03-02 DIAGNOSIS — D2272 Melanocytic nevi of left lower limb, including hip: Secondary | ICD-10-CM | POA: Diagnosis not present

## 2024-03-02 DIAGNOSIS — D2371 Other benign neoplasm of skin of right lower limb, including hip: Secondary | ICD-10-CM | POA: Diagnosis not present

## 2024-05-24 ENCOUNTER — Other Ambulatory Visit (HOSPITAL_COMMUNITY): Payer: Self-pay

## 2024-05-24 MED ORDER — ROSUVASTATIN CALCIUM 10 MG PO TABS
ORAL_TABLET | ORAL | 5 refills | Status: AC
  Filled 2024-05-24: qty 30, 30d supply, fill #0
  Filled 2024-06-15 – 2024-06-17 (×3): qty 30, 30d supply, fill #1

## 2024-05-25 ENCOUNTER — Other Ambulatory Visit (HOSPITAL_COMMUNITY): Payer: Self-pay

## 2024-06-15 ENCOUNTER — Encounter: Payer: Self-pay | Admitting: Pharmacist

## 2024-06-15 ENCOUNTER — Other Ambulatory Visit: Payer: Self-pay

## 2024-06-15 ENCOUNTER — Other Ambulatory Visit (HOSPITAL_COMMUNITY): Payer: Self-pay

## 2024-06-16 ENCOUNTER — Other Ambulatory Visit (HOSPITAL_COMMUNITY): Payer: Self-pay

## 2024-06-16 MED ORDER — ROSUVASTATIN CALCIUM 10 MG PO TABS: 10.0000 mg | ORAL_TABLET | Freq: Every day | ORAL | 1 refills | Status: AC

## 2024-06-17 ENCOUNTER — Other Ambulatory Visit: Payer: Self-pay

## 2024-06-20 ENCOUNTER — Other Ambulatory Visit (HOSPITAL_COMMUNITY): Payer: Self-pay
